# Patient Record
Sex: Female | Born: 1950 | Race: White | Hispanic: No | Marital: Married
Health system: Southern US, Community
[De-identification: ages and names within clinical notes are randomized; demographics above are authoritative.]

---

## 2002-07-13 ENCOUNTER — Ambulatory Visit (HOSPITAL_COMMUNITY): Admission: RE | Admit: 2002-07-13 | Discharge: 2002-07-14 | Payer: Self-pay | Admitting: Neurosurgery

## 2002-07-13 ENCOUNTER — Encounter: Payer: Self-pay | Admitting: Neurosurgery

## 2002-08-23 ENCOUNTER — Encounter: Admission: RE | Admit: 2002-08-23 | Discharge: 2002-08-23 | Payer: Self-pay | Admitting: Neurosurgery

## 2002-08-23 ENCOUNTER — Encounter: Payer: Self-pay | Admitting: Neurosurgery

## 2002-09-13 ENCOUNTER — Encounter: Admission: RE | Admit: 2002-09-13 | Discharge: 2002-09-13 | Payer: Self-pay | Admitting: Neurosurgery

## 2002-09-13 ENCOUNTER — Encounter: Payer: Self-pay | Admitting: Neurosurgery

## 2002-10-13 ENCOUNTER — Encounter: Payer: Self-pay | Admitting: Neurosurgery

## 2002-10-13 ENCOUNTER — Encounter: Admission: RE | Admit: 2002-10-13 | Discharge: 2002-10-13 | Payer: Self-pay | Admitting: Neurosurgery

## 2003-05-27 ENCOUNTER — Encounter: Payer: Self-pay | Admitting: Neurosurgery

## 2003-05-27 ENCOUNTER — Encounter: Admission: RE | Admit: 2003-05-27 | Discharge: 2003-05-27 | Payer: Self-pay | Admitting: Neurosurgery

## 2006-04-02 ENCOUNTER — Ambulatory Visit (HOSPITAL_COMMUNITY): Admission: RE | Admit: 2006-04-02 | Discharge: 2006-04-02 | Payer: Self-pay | Admitting: Surgery

## 2006-04-08 ENCOUNTER — Ambulatory Visit (HOSPITAL_COMMUNITY): Admission: RE | Admit: 2006-04-08 | Discharge: 2006-04-08 | Payer: Self-pay | Admitting: Surgery

## 2006-04-14 ENCOUNTER — Encounter: Admission: RE | Admit: 2006-04-14 | Discharge: 2006-07-13 | Payer: Self-pay | Admitting: Surgery

## 2006-07-08 ENCOUNTER — Ambulatory Visit (HOSPITAL_COMMUNITY): Admission: RE | Admit: 2006-07-08 | Discharge: 2006-07-08 | Payer: Self-pay | Admitting: Surgery

## 2007-01-20 ENCOUNTER — Ambulatory Visit: Payer: Self-pay | Admitting: Cardiology

## 2008-03-31 ENCOUNTER — Ambulatory Visit (HOSPITAL_COMMUNITY): Admission: RE | Admit: 2008-03-31 | Discharge: 2008-03-31 | Payer: Self-pay | Admitting: Surgery

## 2008-04-04 ENCOUNTER — Encounter: Admission: RE | Admit: 2008-04-04 | Discharge: 2008-05-09 | Payer: Self-pay | Admitting: Surgery

## 2008-08-16 ENCOUNTER — Encounter: Admission: RE | Admit: 2008-08-16 | Discharge: 2008-08-16 | Payer: Self-pay | Admitting: Neurosurgery

## 2008-08-25 ENCOUNTER — Encounter: Admission: RE | Admit: 2008-08-25 | Discharge: 2008-09-26 | Payer: Self-pay | Admitting: Surgery

## 2008-09-12 ENCOUNTER — Inpatient Hospital Stay (HOSPITAL_COMMUNITY): Admission: RE | Admit: 2008-09-12 | Discharge: 2008-09-14 | Payer: Self-pay | Admitting: Surgery

## 2008-09-13 ENCOUNTER — Ambulatory Visit: Payer: Self-pay | Admitting: Vascular Surgery

## 2008-09-13 ENCOUNTER — Encounter (INDEPENDENT_AMBULATORY_CARE_PROVIDER_SITE_OTHER): Payer: Self-pay | Admitting: Surgery

## 2008-09-27 ENCOUNTER — Encounter: Admission: RE | Admit: 2008-09-27 | Discharge: 2008-11-08 | Payer: Self-pay | Admitting: Surgery

## 2009-01-16 ENCOUNTER — Inpatient Hospital Stay (HOSPITAL_COMMUNITY): Admission: RE | Admit: 2009-01-16 | Discharge: 2009-01-19 | Payer: Self-pay | Admitting: Neurosurgery

## 2009-03-02 ENCOUNTER — Encounter: Admission: RE | Admit: 2009-03-02 | Discharge: 2009-03-02 | Payer: Self-pay | Admitting: Neurosurgery

## 2009-03-30 ENCOUNTER — Encounter: Admission: RE | Admit: 2009-03-30 | Discharge: 2009-03-30 | Payer: Self-pay | Admitting: Neurosurgery

## 2009-06-20 ENCOUNTER — Encounter: Admission: RE | Admit: 2009-06-20 | Discharge: 2009-06-20 | Payer: Self-pay | Admitting: Neurosurgery

## 2009-07-11 ENCOUNTER — Encounter: Admission: RE | Admit: 2009-07-11 | Discharge: 2009-07-11 | Payer: Self-pay | Admitting: Neurosurgery

## 2009-09-07 ENCOUNTER — Encounter: Admission: RE | Admit: 2009-09-07 | Discharge: 2009-09-07 | Payer: Self-pay | Admitting: Neurosurgery

## 2009-10-11 ENCOUNTER — Inpatient Hospital Stay (HOSPITAL_COMMUNITY): Admission: RE | Admit: 2009-10-11 | Discharge: 2009-10-15 | Payer: Self-pay | Admitting: Neurosurgery

## 2009-12-12 ENCOUNTER — Encounter: Admission: RE | Admit: 2009-12-12 | Discharge: 2009-12-12 | Payer: Self-pay | Admitting: Neurosurgery

## 2010-01-23 ENCOUNTER — Encounter: Admission: RE | Admit: 2010-01-23 | Discharge: 2010-01-23 | Payer: Self-pay | Admitting: Neurosurgery

## 2010-03-06 ENCOUNTER — Encounter: Admission: RE | Admit: 2010-03-06 | Discharge: 2010-03-06 | Payer: Self-pay | Admitting: Neurosurgery

## 2010-03-23 IMAGING — CR DG LUMBAR SPINE 2-3V
3 series · 3 of 3 positions shown · non-contrast
Comparison: None

CLINICAL DATA: Low back pain.

LUMBAR SPINE - 2-3 VIEW

[w l-spine lat *]
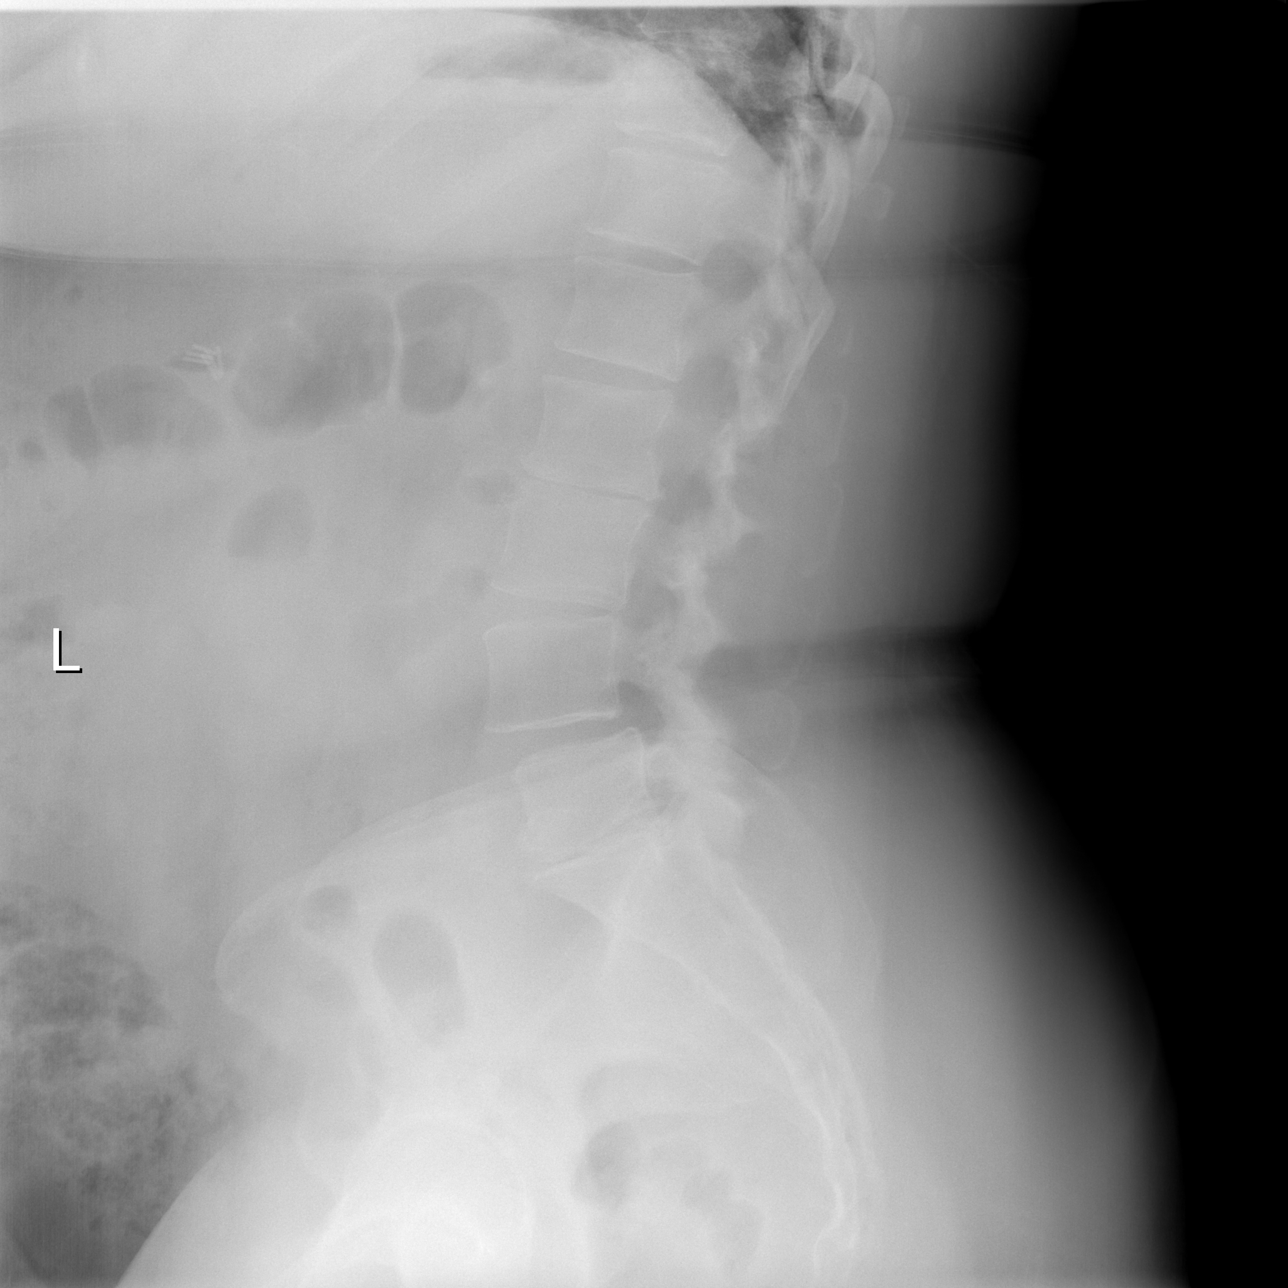

[w l-spine lat flexion *]
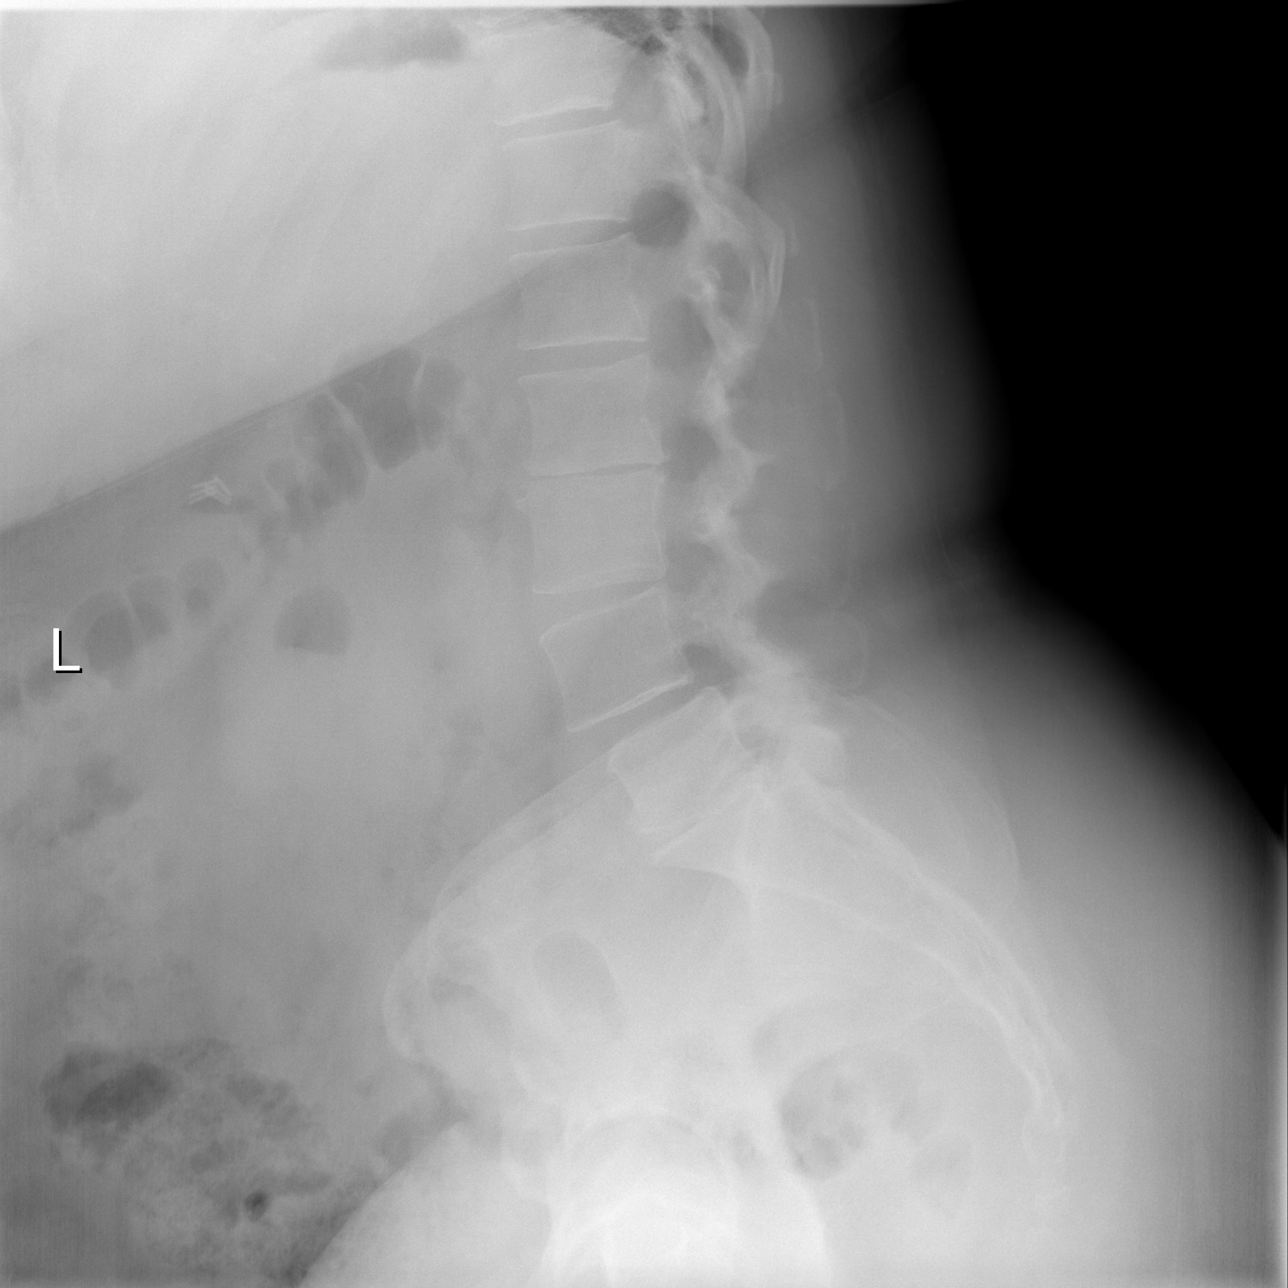

[w l-spine lat extension *]
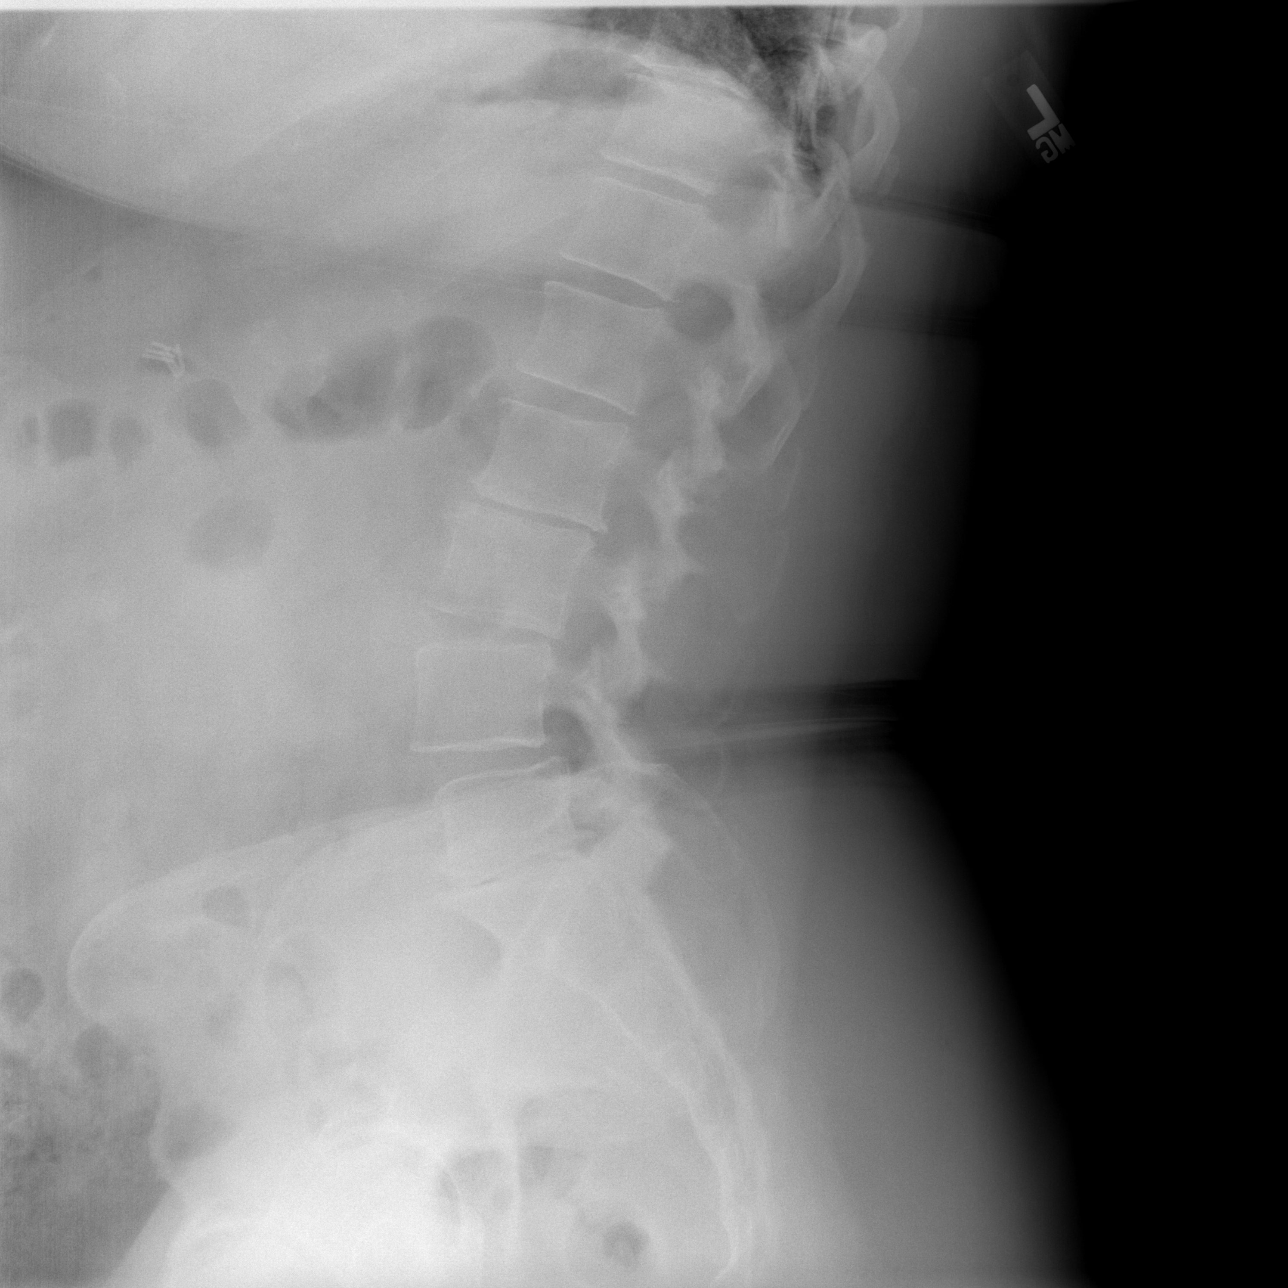

[3 of 3 positions shown; findings below may reference images not displayed]

FINDINGS: Assuming five non-rib bearing lumbar vertebrae advanced
degenerative disc disease is seen at L2-3 and L5-S1.  3 mm
anterolisthesis is seen with slight degenerative disc space
narrowing at L4-5 on neutral lateral upright view which measures 4
mm on flexion lateral view and 5 mm on extension view.  Slight
posterior spondylosis is seen at L2-3. Remaining vertebral
alignment is normally maintained.
IMPRESSION: 1.  Advanced degenerative disc disease L2-3 and L5-S1 with slight
posterior spondylosis L2-3.
2.  3-5 mm anterolisthesis L4-5 as described with otherwise normal
vertebral alignment.
3.  Slight degenerative disc space narrowing L4-5.
4.  No additional acute findings.

## 2010-06-21 ENCOUNTER — Encounter: Admission: RE | Admit: 2010-06-21 | Discharge: 2010-06-21 | Payer: Self-pay | Admitting: Neurosurgery

## 2010-09-04 ENCOUNTER — Encounter: Admission: RE | Admit: 2010-09-04 | Discharge: 2010-09-04 | Payer: Self-pay | Admitting: Neurosurgery

## 2010-10-18 ENCOUNTER — Encounter
Admission: RE | Admit: 2010-10-18 | Discharge: 2010-10-18 | Payer: Self-pay | Source: Home / Self Care | Attending: Neurosurgery | Admitting: Neurosurgery

## 2010-12-13 ENCOUNTER — Other Ambulatory Visit: Payer: Self-pay | Admitting: Neurosurgery

## 2010-12-13 DIAGNOSIS — M545 Low back pain: Secondary | ICD-10-CM

## 2010-12-18 ENCOUNTER — Ambulatory Visit
Admission: RE | Admit: 2010-12-18 | Discharge: 2010-12-18 | Disposition: A | Discharge status: Home / Self Care | Payer: BC Managed Care – PPO | Source: Ambulatory Visit | Attending: Neurosurgery | Admitting: Neurosurgery

## 2010-12-18 DIAGNOSIS — M545 Low back pain: Secondary | ICD-10-CM

## 2011-01-07 ENCOUNTER — Encounter (HOSPITAL_COMMUNITY)
Admission: RE | Admit: 2011-01-07 | Discharge: 2011-01-07 | Disposition: A | Discharge status: Home / Self Care | Payer: BC Managed Care – PPO | Source: Ambulatory Visit | Attending: Neurosurgery | Admitting: Neurosurgery

## 2011-01-07 LAB — CBC
HCT: 34.9 % — ABNORMAL LOW (ref 36.0–46.0)
Hemoglobin: 11.2 g/dL — ABNORMAL LOW (ref 12.0–15.0)
MCHC: 32.1 g/dL (ref 30.0–36.0)
MCV: 89 fL (ref 78.0–100.0)
Platelets: 337 10*3/uL (ref 150–400)

## 2011-01-07 LAB — BASIC METABOLIC PANEL
Calcium: 9.1 mg/dL (ref 8.4–10.5)
Chloride: 101 mEq/L (ref 96–112)
Creatinine, Ser: 0.58 mg/dL (ref 0.4–1.2)
Potassium: 4.1 mEq/L (ref 3.5–5.1)

## 2011-01-07 LAB — SURGICAL PCR SCREEN: MRSA, PCR: NEGATIVE

## 2011-01-10 ENCOUNTER — Inpatient Hospital Stay (HOSPITAL_COMMUNITY): Payer: BC Managed Care – PPO

## 2011-01-10 ENCOUNTER — Inpatient Hospital Stay (HOSPITAL_COMMUNITY)
Admission: RE | Admit: 2011-01-10 | Discharge: 2011-01-11 | DRG: 756 | Disposition: A | Discharge status: Home / Self Care | Payer: BC Managed Care – PPO | Source: Ambulatory Visit | Attending: Neurosurgery | Admitting: Neurosurgery

## 2011-01-10 DIAGNOSIS — K219 Gastro-esophageal reflux disease without esophagitis: Secondary | ICD-10-CM | POA: Diagnosis present

## 2011-01-10 DIAGNOSIS — Z9884 Bariatric surgery status: Secondary | ICD-10-CM

## 2011-01-10 DIAGNOSIS — Y831 Surgical operation with implant of artificial internal device as the cause of abnormal reaction of the patient, or of later complication, without mention of misadventure at the time of the procedure: Secondary | ICD-10-CM | POA: Diagnosis present

## 2011-01-10 DIAGNOSIS — Z981 Arthrodesis status: Secondary | ICD-10-CM

## 2011-01-10 DIAGNOSIS — Z87891 Personal history of nicotine dependence: Secondary | ICD-10-CM

## 2011-01-10 DIAGNOSIS — Y92009 Unspecified place in unspecified non-institutional (private) residence as the place of occurrence of the external cause: Secondary | ICD-10-CM

## 2011-01-10 DIAGNOSIS — T84498A Other mechanical complication of other internal orthopedic devices, implants and grafts, initial encounter: Principal | ICD-10-CM | POA: Diagnosis present

## 2011-01-13 NOTE — Op Note (Signed)
NAMEBRITTEN, Veronica Miller            ACCOUNT NO.:  [id number]  MEDICAL RECORD NO.:  [id number]           PATIENT TYPE:  I  LOCATION:  3524                         FACILITY:  [facility]  PHYSICIAN:  Donalee Citrin, M.D.        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  PROCEDURES:  Re-exploration of fusion, removal of hardware L2-L5, redo posterolateral fusion L2-3 with BMP and master graft and replacement of right L2 pedicle screw.  SURGEON:  Donalee Citrin, MD  ANESTHESIA.:  General endotracheal.  HISTORY OF PRESENT ILLNESS:  The patient is a very pleasant 59 year old female who has undergone previously L2-L5 fusion and healed the L3-L5 segment very well.  Based on CT scan, however, the patient looked like she developed pseudarthrosis at L2-3 with loosening of her L2 screws. There did appear to be some bone that formed in the interspace posteriorly and some posterolateral bone on the right.  However, there was significant healing around the L2 screws and the patient had persistent worsening back pain, so I recommended the patient re- exploration of fusion, removal of hardware, exploration of fusion at L2- 3 and possible redo fusion L2-3.  However, the patient had significant very small pedicles at L2 and the left-sided L2 screw.  There was no additional room for replacement of that screw, so I have told the patient that I would only be able to put hardware on the right should I have to replace that.  She understand the risks and agreed to proceed forward.  DESCRIPTION OF PROCEDURE:  The patient was brought to the OR and induced under anesthesia, positioned prone on Wilson frame.  Back was prepped and draped in the usual fashion.  Her old incision was opened up. Hardware was identified.  The cross-link was removed.  The nuts were removed.  The  screws at L2 were just freely floating.  These were subsequently removed.  All the screws on the left from L2-L5 were removed.  The L3 screw on the right was left behind and the L4 and L5 screws were removed.  The fusion construct was solid from L3 down to L5. The L2-3 area appeared to have small amount of movement on testing, so it was elected to redo this fusion, so I carried the dissection out exposing the T-piece at L2-3 bilaterally.  Then after aggressive decortication of the T-piece after copious irrigation creating a lot of bone dust and fragments, the BMP sponge small kit was opened up and overlaid posterolaterally on the left without master graft wedge over the top of this and then on the right.  In a similar fashion, aggressive decortication was carried out in T-piece.  BMP and master graft was overlaid at L2-3.  The pedicle on the right at L2 was probed and a 45- screw had been removed, so it was replaced with a 5550 and this screw did appear to have excellent purchase especially in the distal engagement.  Fluoroscopy was used to place that screw and confirm depth  and trajectory.  This was then locked and placed with top tightening nuts with a 40-mm rod.  Then, a large Hemovac drain was placed and the wound was closed in layers with interrupted Vicryl and the skin was closed with running 4-0 subcuticular.  Benzoin and Steri-Strips were applied.  The patient went to the recovery room in stable condition.  At the end of the case, sponge and instrument counts were correct.          ______________________________ Donalee Citrin, M.D.     GC/MEDQ  D:  01/10/2011  T:  01/11/2011  Job:  [id number]  Electronically Signed by Donalee Citrin M.D. on 01/13/2011 03:59:38 PM

## 2011-01-29 LAB — CBC
MCHC: 33.7 g/dL (ref 30.0–36.0)
Platelets: 340 10*3/uL (ref 150–400)
RBC: 3.8 MIL/uL — ABNORMAL LOW (ref 3.87–5.11)
WBC: 6.4 10*3/uL (ref 4.0–10.5)

## 2011-01-29 LAB — BASIC METABOLIC PANEL
BUN: 15 mg/dL (ref 6–23)
CO2: 30 mEq/L (ref 19–32)
Calcium: 9.4 mg/dL (ref 8.4–10.5)
Creatinine, Ser: 0.58 mg/dL (ref 0.4–1.2)
GFR calc Af Amer: >60 mL/min (ref >60)

## 2011-01-29 LAB — TYPE AND SCREEN
ABO/RH(D): A POS
Antibody Screen: NEGATIVE

## 2011-01-31 NOTE — Discharge Summary (Signed)
  NAMECERRA, EISENHOWER            ACCOUNT NO.:  [id number]  MEDICAL RECORD NO.:  [id number]           PATIENT TYPE:  I  LOCATION:  3524                         FACILITY:  [facility]  PHYSICIAN:  Donalee Citrin, M.D.        DATE OF BIRTH:  Mar 24, 1951  DATE OF ADMISSION:  01/10/2011 DATE OF DISCHARGE:  01/11/2011                              DISCHARGE SUMMARY   ADMITTING DIAGNOSIS:  Painful hardware.  DISCHARGE DIAGNOSES:  Painful hardware along with pseudoarthrosis at L2- 3.  HISTORY OF PRESENT ILLNESS:  The patient is very pleasant 60 year old female who underwent previous L2-S1 fusion presented with persistent back pain felt to be related to painful hardware with pseudarthrosis at L2-3.  The patient went to the operating room and diagnosed with pseudoarthrosis, underwent hardware removal and replacement one screw. Postoperatively, the patient did very well and recovering on the floor, was ambulating and voiding spontaneously on the floor by day #1.  Wound was clean and dry, and the patient was able be discharged home with scheduled followup in approximately 2 weeks on hospital day #1.          ______________________________ Donalee Citrin, M.D.     GC/MEDQ  D:  01/23/2011  T:  01/24/2011  Job:  [id number]  Electronically Signed by Donalee Citrin M.D. on 01/31/2011 08:36:56 AM

## 2011-02-07 LAB — CBC
HCT: 28.6 % — ABNORMAL LOW (ref 36.0–46.0)
HCT: 35.1 % — ABNORMAL LOW (ref 36.0–46.0)
Hemoglobin: 11.7 g/dL — ABNORMAL LOW (ref 12.0–15.0)
Hemoglobin: 9.5 g/dL — ABNORMAL LOW (ref 12.0–15.0)
MCHC: 33.3 g/dL (ref 30.0–36.0)
MCHC: 33.7 g/dL (ref 30.0–36.0)
MCV: 83.5 fL (ref 78.0–100.0)
MCV: 84 fL (ref 78.0–100.0)
MCV: 84.3 fL (ref 78.0–100.0)
MCV: 84.7 fL (ref 78.0–100.0)
Platelets: 197 10*3/uL (ref 150–400)
Platelets: 207 10*3/uL (ref 150–400)
Platelets: 251 10*3/uL (ref 150–400)
RBC: 3.09 MIL/uL — ABNORMAL LOW (ref 3.87–5.11)
RBC: 3.2 MIL/uL — ABNORMAL LOW (ref 3.87–5.11)
RBC: 3.37 MIL/uL — ABNORMAL LOW (ref 3.87–5.11)
RBC: 4.21 MIL/uL (ref 3.87–5.11)
RDW: 17.8 % — ABNORMAL HIGH (ref 11.5–15.5)
RDW: 18.3 % — ABNORMAL HIGH (ref 11.5–15.5)
WBC: 11.1 10*3/uL — ABNORMAL HIGH (ref 4.0–10.5)
WBC: 8 10*3/uL (ref 4.0–10.5)

## 2011-02-07 LAB — BASIC METABOLIC PANEL
CO2: 30 mEq/L (ref 19–32)
Calcium: 9.3 mg/dL (ref 8.4–10.5)
Chloride: 103 mEq/L (ref 96–112)
GFR calc Af Amer: >60 mL/min (ref >60)
Glucose, Bld: 94 mg/dL (ref 70–99)
Sodium: 141 mEq/L (ref 135–145)

## 2011-02-07 LAB — TYPE AND SCREEN: Antibody Screen: NEGATIVE

## 2011-02-12 ENCOUNTER — Ambulatory Visit
Admission: RE | Admit: 2011-02-12 | Discharge: 2011-02-12 | Disposition: A | Discharge status: Home / Self Care | Payer: BC Managed Care – PPO | Source: Ambulatory Visit | Attending: Neurosurgery | Admitting: Neurosurgery

## 2011-02-12 ENCOUNTER — Other Ambulatory Visit: Payer: Self-pay | Admitting: Neurosurgery

## 2011-02-12 DIAGNOSIS — M545 Low back pain: Secondary | ICD-10-CM

## 2011-02-12 DIAGNOSIS — M79609 Pain in unspecified limb: Secondary | ICD-10-CM

## 2011-03-12 NOTE — Op Note (Signed)
NAMERUSHIE, BRAZEL            ACCOUNT NO.:  [id number]   MEDICAL RECORD NO.:  [id number]          PATIENT TYPE:  INP   LOCATION:  1225                         FACILITY:  [facility]   PHYSICIAN:  Alfonse Ras, MD   DATE OF BIRTH:  [birth year]   DATE OF PROCEDURE:  DATE OF DISCHARGE:                               OPERATIVE REPORT   PREOPERATIVE DIAGNOSIS:  Status post laparoscopic Roux-en-Y gastric  bypass.   POSTOPERATIVE DIAGNOSIS:  Status post laparoscopic Roux-en-Y gastric  bypass, no evidence of leak at the anastomosis.   PROCEDURE:  Upper endoscopy.   DESCRIPTION:  With completion of laparoscopic Roux-en-Y gastric bypass  by Dr. Ezzard Standing, I introduced the Olympus endoscope in the oropharynx and  down the esophagus and D-line was identified.  At the 41 cm, the length  of the pouch was 5-cm at an patent anastomosis and no evidence of leak.  The remainder of the operative report will be dictated by Dr. Ezzard Standing.      Alfonse Ras, MD  Electronically Signed     KRE/MEDQ  D:  09/12/2008  T:  09/13/2008  Job:  [id number]

## 2011-03-12 NOTE — Op Note (Signed)
Veronica Miller, Veronica Miller NO.:  [id number]   MEDICAL RECORD NO.:  [id number]          PATIENT TYPE:  INP   LOCATION:  3002                         FACILITY:  [facility]   PHYSICIAN:  Donalee Citrin, M.D.        DATE OF BIRTH:  Sep 12, 1951   DATE OF PROCEDURE:  01/16/2009  DATE OF DISCHARGE:                               OPERATIVE REPORT   PREOPERATIVE DIAGNOSIS:  1. Degenerative disk disease, L4-L5 and L5-S1.  2. Severe lumbar spinal stenosis, L4-L5 and L5-S1.  3. 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   PROCEDURES:  Decompressive lumbar laminectomies, L4-L5 and L5-S1 and  posterior lumbar interbody fusion, L4-L5 and L5-S1 using a hybrid  Telamon PEEK cage packed with local autograft mixed with Actifuse and a  tangent allograft wedge at each level.  Pedicle screw fixation at L4-S1  using a 6.35 Legacy pedicle screw system.  Posterolateral arthrodesis L4-  S1 using local autograft packed with Actifuse bone substitute.   SURGEON:  Donalee Citrin, MD   ASSISTANT:  Kathaleen Maser. Pool, MD   ANESTHESIA:  General endotracheal.   HISTORY OF PRESENT ILLNESS:  The patient is a very pleasant 60 year old  female with progressive worsening back and leg pain going on now for  several months and years pain that was predominantly mechanical and  relatively worse with sitting to standing position and pain that would  run down her both legs, right leg greater down to the top of foot big  toe.  The patient had been refractory to all forms of conservative  treatment.  MRI scan showed severe degenerative disk disease with  diastasis of facet joints, synovial cyst on the left at L4-L5, severe  degenerative disease with motor changes at L5-S1, degenerative collapse  and biforaminal stenosis at the L5-S1 nerve roots.  Due to the patient's  failure to conservative treatment, clinical exam, and MRI findings, the  patient was recommended  decompression and stabilization procedure.  Risks and benefits of the operation were explained to the patient who  understood and agreed to proceed forward.   PROCEDURE IN DETAIL:  The patient was brought to the OR and induced  under general anesthesia, positioned prone on the Wilson frame.  Back  was prepped and draped in sterile fashion.  Preoperative x-ray localized  the appropriate level.  After infiltration of 10 mL of lidocaine with  epinephrine, a midline incision was made.  Bovie electrocautery was used  to take down the subperiosteum and dissection was then carried out  exposing the T-piece at L4, L5 and S1 bilaterally.  Intraoperative x-ray  confirmed localization at the appropriate level.  The self-retaining  retractors were then placed and the spinous processes at L4-L5 were  removed.  Complete medial facetectomies were drilled down and performed  after central decompression.  Decompressing the central canal, there was  noted to be marked stenosis in an hourglass fashion, predominantly at L5-  S1 but also at L4-L5 and marked diastasis of L4-L5 facet complexes.  The  synovial cyst on the  left was immediately identified.  This was removed  in piecemeal fashion.  Radical foraminotomies were carried out in the  L4, L5, and S1 nerve roots bilaterally and aggressive underbiting of the  superior facet process at each level was carried out to gain lateral  access to the disk space.  Then attention was first taken to pedicle  screw placement.  First working on the right L4 and the right was  drilled and a cannula with the awl probed.  A 5.5 tap initially was  placed and a 6.5 x 45 screw was inserted on the right.  Fluoroscopy used  to easily step along the way and the probe was tapped to easily step  along the way, however, during placement of the 6.5 x 45 screw, the  screw was noted to be deviating laterally.  AP fluoroscopy confirmed  these were to be laterally placed screws.  The  screw was removed.  A new  hole was selected and redirected more medially.  Capture of the vertebra  was then achieved and tapped with a 4.5 tap at this time and a 5.5 x 45  screw inserted with excellent purchase.  In a similar fashion at L5-S1,  6.5 x 45 screws were inserted at L5 and a 6.5 x 35 at S1.  All these  pedicle screws had excellent purchase and then on the left side, a 5.5 x  45 screw left at L4, 6.5 x 45 at L5, and 6.5 x 35 at S1.  After all 6  screws were placed, attention was taken to interbody work first working  at L4-L5.  This was noted to be markedly degenerated and this was  cleaned out and a size 10 distractor was inserted.  This had good  apposition and the endplates were felt to be appropriate size into the  graft material.  Then using a D'errico reflecting the left L5 nerve root  medially, this was then cleaned out with a size 10 cutter and chisel  endplates were prepped.  Central disk was then removed and scraped and  then a Telamon PEEK cage packed with local autograft mixed with Actifuse  was inserted on the patient's left side.  The right side distractor was  inserted in similar fashion.  Endplates were scraped.  Disk spaces were  adequately cleaned out, both centrally and laterally.  Local autograft  was packed centrally and a right-sided tangent was inserted.  At L5-S1,  the incision was noted to be markedly collapsed and had to be  sequentially distracted up from a 7 distractor to an 8 distractor and  cleaned out with a size 8 cutter and chisel.  Using Telamon on the right  and tangent on the left local autograft centrally, this disk space was  opened up, cleaned out, and noted to be markedly degenerated large  posterior osteophytes.  After all had been scraped away and removed, the  thecal sac was decompressed.  The wound was then copiously irrigated.  Meticulous hemostasis was maintained.  Gelfoam was laid on top of the  dura and then attention taken to  postop fluoroscopy, both AP and lateral  showed good position of screws and bone grafts.  Then aggressive  decortication of T-piece and the lateral gutters remainder of the local  autograft was packed posterolaterally.  The 7-mm rods were inserted and  tightened with S1-L5 screws.  Compression against S1 and the L4  compressed against the L5 taking care not to compress the right-sided L4  screw very much secondary to the malplacement of the venous or screw.  A  cross-link was inserted between the L5-S1 screws.  After all neural  foramina, I reinspected and noted be widely patent.  Gelfoam was then  overlaid on top of the dura.  A large Hemovac drain was placed.  Muscle  and fascia were reapproximated with interrupted Vicryls and skin was  closed with running 4-0 subcuticular.  Benzoin and Steri-Strips were  applied.  The patient went to the recovery room in stable condition.           ______________________________  Donalee Citrin, M.D.     GC/MEDQ  D:  01/16/2009  T:  01/17/2009  Job:  [id number]

## 2011-03-12 NOTE — Discharge Summary (Signed)
NAMETERRI, RORRER NO.:  [id number]   MEDICAL RECORD NO.:  [id number]          PATIENT TYPE:  INP   LOCATION:  1528                         FACILITY:  [facility]   PHYSICIAN:  Sandria Bales. Ezzard Standing, M.D.  DATE OF BIRTH:  1951-04-09   DATE OF ADMISSION:  09/12/2008  DATE OF DISCHARGE:  09/14/2008                               DISCHARGE SUMMARY   DISCHARGE DIAGNOSES:  1. Morbid obesity (weight of 255, body mass index of 40).  2. Sleep apnea on bilevel positive airway pressure.  3. History of hypertension.  4. Gastroesophageal reflux disease.  5. Allergies and bronchitis year round.  6. Osteoarthritis with neck and knee pain.   OPERATIONS PERFORMED:  The patient underwent a laparoscopic Roux-en-Y  gastric bypass on September 12, 2008.   HISTORY OF ILLNESS:  Ms. Amberg is a 60 year old white female, a patient  Dr. Fara Chute, who has been morbidly obese much of her adult life.  We  saw her for consideration for bariatric surgery and she went through our  bariatric preop program for evaluation for bariatric surgery.   COMORBID CONDITIONS:  1. Hypertension.  2. Sleep apnea on BiPAP.  3. Allergy and bronchitis which she has year round.  4. Gastroesophageal reflux disease.  5. Osteoarthritis involving her knees and neck which cause discomfort      and pain.   Discussion carried out with her about the options for surgical treatment  for obesity and she is interested in a laparoscopic Roux-en-Y gastric  bypass.   HOSPITAL COURSE:  On the day of admission, she was admitted to [facility] and underwent a laparoscopic Roux-en-Y gastric bypass  (antecolic and antegastric).   Postoperatively, she did well.  On her first postoperative day, her  hemoglobin was 11, hematocrit 34, white blood count 11,700.  She  underwent a gastrograffin swallow which showed no evidence of leak or  obstruction.  She underwent lower extremity Dopplers which showed no  evidence of DVT.   She was started on water.   By the second postoperative day, her hemoglobin is 11, hematocrit 33,  white blood count of 10,700.  She is afebrile and she will be advanced  to a protein drink to postop day 2 diet.  The plan is to let her go home  today.   DISCHARGE INSTRUCTIONS:  1. She should stay on her gastric bypass and diet until advanced by      the nutritionist.  2. She can shower.  3. She should not drive for 3-4 days.  4. No heavy lifting for 2 weeks.   DISCHARGE MEDICATIONS:  1. She is given Roxicet elixir for discharge as a pain medicine.  2. She can resume her home medications including Protonix 40 mg daily.  3. Hydrochlorothiazide 25 mg daily.  4. Allopurinol 300 mg daily.  5. Fexofenadine or Allegra 180 mg at bedtime.  6. Tiazac ER 120 mg nightly.  7. Osteo-Biflex.  8. Vitamin B complex.  9. Biotin.  10.Hydrocodone p.r.n.   CONDITION ON DISCHARGE:  Good.   FOLLOW UP:  Her return appointment to me again to see  me in about 2-3  weeks.      Sandria Bales. Ezzard Standing, M.D.  Electronically Signed     DHN/MEDQ  D:  09/14/2008  T:  09/14/2008  Job:  [id number]   cc:   Fara Chute  Fax: [phone]

## 2011-03-12 NOTE — Op Note (Signed)
NAMECARLO, LORSON            ACCOUNT NO.:  [id number]   MEDICAL RECORD NO.:  [id number]          PATIENT TYPE:  INP   LOCATION:  1225                         FACILITY:  [facility]   PHYSICIAN:  Sandria Bales. Ezzard Standing, M.D.  DATE OF BIRTH:  08/15/1951   DATE OF PROCEDURE:  09/12/2008  DATE OF DISCHARGE:                               OPERATIVE REPORT   Date of surgery ??   PREOPERATIVE DIAGNOSIS:  Morbid obesity, weight of 255, BMI of 40.   POSTOPERATIVE DIAGNOSIS:  Morbid obesity, weight of 255, BMI of 40.   PROCEDURE:  Laparoscopic Roux-en-Y gastric bypass  (anticolic,  antegastric).  Enterolysis of adhesions to anterior abdominal wall (15 minutes).   SURGEON:  Dr. Ovidio Kin.   FIRST ASSISTANT:  Dr. Baruch Merl.   ANESTHESIA:  General endotracheal.   ESTIMATED BLOOD LOSS:  Minimal.   INDICATIONS FOR PROCEDURE:  Ms. Stepanian is a 60 year old white female who  is a patient of Dr. Fara Chute, who has been through our preop  bariatric program and now comes for attempted laparoscopic Roux-en-Y  gastric bypass.   The indications and potential complications surgery were explained to  the patient.  Potential complications include, but not limited to,  bleeding, infection, bowel leak, deep venous thrombosis/pulmonary  embolism and long-term nutritional consequences.   OPERATIVE NOTE:  The patient was placed in the supine position.  A time-  out was held identifying the patient and the procedure.  Her abdomen was  prepped with Techni-Care and sterilely draped.  A Foley was placed.  She  was given antibiotics and had PAS stockings in place.   The abdomen was accessed through the left upper quadrant with 12-mm  Ethicon trocar.  I then placed six additional trocars were total of  seven.  There was a 5-mm subxiphoid trocar, a 12-mm right subcostal  trocar and 12 mm right paramedian trocar, an 11 mm infraumbilical  trocar, a 12 mm trocar to the left of midline and then a 5 mm left  lateral abdominal trocar.   I spent about the first 15 to 30 minutes taking down adhesions.  She had  some omental adhesions to her anterior abdominal wall.  These were less  than I expected because I did her gallbladder a couple of years ago and  most of the adhesions I had taken down at that time remained down.  She  also had adhesions of omentum stuck down to the pelvis.  I divided  those.  This took about 15 to 30 minutes of time.  I then was able to  elevate the colon.  I found the ligament of Treitz.  I counted 40 cm and  divided the small bowel with a white load of the Endo GIA 45-mm stapler.  I placed a Penrose drain on the future gastric limb and divided the  mesentery down about 5 cm.   I then counted 100 cm for the future gastric limb and carried out a side-  to-side jejunojejunal anastomosis with a 45-mm load of the Ethicon white  staples.  I closed the enterotomy with two running 2-0 Vicryl sutures  and closed the mesentery with a running 2-0 silk suture with Laparo-Ties  on both ends of this suture.  The diastasis looked good and I put  Tisseel over the anastomosis.  I then divided the omentum down to the  transverse colon.  This was particularly since some of the right side of  omentum was still stuck to the right lower quadrant where prior  appendectomy was done but this seemed to give a good window to her  future stomach pouch.   I then placed the patient in reverse Trendelenburg, placed a Nathanson  retractor on the left lobe of her liver.  I identified the angle of His  which I opened.  I went along the lesser curvature approximately 5 cm,  got into the lesser sac and then divided the stomach with first a firing  of the blue 45 mm Ethicon stapler, then two firings of the 60 mm Echelon  stapler, then another firing of the 45.  This created a pouch  approximately 5 cm in length, 3 cm in width.  I oversewed the  defunctioned stomach pouch with a locking 2-0 Vicryl  suture.   She had a little bleeding I think from one short gastric.  I did place  two clips up right at the angle of His under the diaphragm and this  seemed to control that bleeding.  I also packed it with some Surgicel.  I then brought the small bowel anticolic-antegastric and sewed a  posterior running suture of 2-0 Vicryl suture.  I made an enterotomy  between the stomach and the small bowel and used a blue load of a 45  Ethicon stapler.  I tried to create an anastomosis about 2.5 to 3 cm.  I  then closed the enterotomy with two running 2-0 Vicryl sutures.  I  passed the Ewald tube through this anastomosis and then oversewed an  anterior running suture of 2-0 Vicryl suture.  There was a single  bleeder in the anterior gastric wall.  I did a figure-of-eight stitch  over this which seemed to control the bleeding.   I did try to close the Peterson's defect with a suture between the  mesentery of the small bowel and the transverse colon.  This was with 2-  0 silk figure-of-eight stitch.  At this point Dr. Colin Benton broke scrub.  She did upper endoscopy.  She identified the GE junction about 40 cm,  anastomosis about 45 cm.  There was no bleeding.  The  anastomosis was  patent and I submerged the anastomosis underwater.  There was no  bubbling.   I then aspirated the fluid out.  I placed Tisseel over the gastrojejunal  anastomosis.  I reinspected each anastomosis, saw no evidence of any  bleeding, no other injury to the bowel was noted.  I then removed each  trocar in turn along with the [facility] retractor.  I closed the skin  wounds with 5-0 Monocryl suture, painted each wound with tincture of  Benzoin and steri-stripped it.   The patient tolerated the procedure well and was transported to recovery  room in good condition.  Sponge and needle count were correct at the end  of the case.      Sandria Bales. Ezzard Standing, M.D.  Electronically Signed     DHN/MEDQ  D:  09/12/2008  T:  09/13/2008   Job:  [id number]   cc:   Fara Chute  Fax: [phone]

## 2011-03-15 NOTE — Discharge Summary (Signed)
NAMELATIMA, HAMZA NO.:  [id number]   MEDICAL RECORD NO.:  [id number]          PATIENT TYPE:  INP   LOCATION:  3002                         FACILITY:  [facility]   PHYSICIAN:  Donalee Citrin, M.D.        DATE OF BIRTH:  Mar 29, 1951   DATE OF ADMISSION:  01/16/2009  DATE OF DISCHARGE:  01/19/2009                               DISCHARGE SUMMARY   ADMITTING DIAGNOSIS:  Degenerative disk disease L4-5, L5-S1 with  posterior lumbar interbody fusion L4-5 and L5-S1.   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   PROCEDURE:  Posterior lumbar interbody fusion L4-5 and L5-S1.   HOSPITAL COURSE:  The patient was admitted and she went to the operating  room and underwent the above-mentioned procedure.  Postop, the patient  did very well, recovering on the floor.  On the floor, the patient was  doing very well on day 1 with low grade temperature of 100.8, ambulating  and voiding, with physical therapy on first day and the patient was  resting, mobilizing well over the next 24-48 hours.  The drain taken out  day #2, her Foley was taken out on day #2, and by day #3, the patient  was tolerating p.o. pain medication.  She was afebrile with T-max of 100  and the patient was discharged home with oral pain medication as well as  an oral antibiotics for 5 days prescription.  Follow up in 1 week.           ______________________________  Donalee Citrin, M.D.     GC/MEDQ  D:  02/08/2009  T:  02/09/2009  Job:  [id number]

## 2011-03-15 NOTE — Op Note (Signed)
NAMEANTONELLA, Veronica Miller                        ACCOUNT NO.:  [id number]   MEDICAL RECORD NO.:  [id number]                   PATIENT TYPE:  OIB   LOCATION:  3172                                 FACILITY:  [facility]   PHYSICIAN:  Veronica Miller, M.D.                  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   PROCEDURE:  Anterior cervical diskectomies and fusion at C4-5 and C5-6 using  6 mm patellar wedge at C4-5, a 7 mm patellar wedge at C5-6, and a 42.5 mm  Atlantis plate with six 13 mm variable-angled screws.   SURGEON:  Veronica Miller, M.D.   ASSISTANT:  Veronica Miller, M.D.   ANESTHESIA:  General endotracheal anesthesia.   CLINICAL NOTE:  The patient is a very pleasant 60 year old female who has  had longstanding neck and right shoulder and arm pain that has been going on  for several months and gotten progressively worse and were refractory to  conservative treatment with anti-inflammatories, physical therapy.  The  patient's preoperative imaging shows severe spondylosis, multilevel disk  degeneration, and foraminal stenosis.  Her symptomatology was predominantly  C5 and C6 on the right with weakness of her triceps and deltoids.  The  patient is recommended anterior cervical diskectomy and fusion.  I  extensively explained the risks and benefits of surgery with her, and she  understands and agrees to proceed forward.   DESCRIPTION OF PROCEDURE:  The patient was brought in the OR and was induced  under general anesthesia, was positioned supine with the neck in slight  extension and five pounds of Holter traction.  The right side of the neck  was prepped and draped in the usual sterile fashion.  A preoperative x-ray  localized the C4-5 disk space, a curvilinear incision  made just inferior to  this, and the superficial layer of the platysma was dissected out and  divided longitudinally.  The avascular plane between the sternocleidomastoid  and the strap muscles was developed down to prevertebral fascia.  The  prevertebral fascia was dissected away with Kitners.  Intraoperative x-ray  confirmed localization of the C4-5 disk space.  Annulotomy was made with a  15 blade scalpel, and pituitary rongeurs were used to remove the anterior  margin of the annulus.  The longus colli was then reflected laterally, a  self-retaining retractor was placed at both C4-5 and C5-6.  Then using a 15  blade scalpel, annulotomy was made at C5-6.  Then using 2 and 3 mm Kerrison  punch, the anterior osteophytes were bitten off at C4-5 and C5-6 disk spaces  and a high-speed drill was used to drill down to the posterior osteophyte  and posterior annulus.  Then using 1 and 2 mm Kerrison punch first at C4-5  and under microscopic illumination, the operating microscope was then draped  and brought into the field.  The osteophyte at C4-5 coming off the C4  vertebral body was removed, exposing the posterior longitudinal ligament,  and this was removed in a piecemeal fashion, exposing the thecal sac and the  left C5 nerve root.  This was decompressed out its foramen and then  attention taken to the right side.  The right C5 nerve root was radically  decompressed out its foramen and explored with an angled nerve hook, noting  no further stenosis.  There were several fragments of bone spur removed off  the uncinate process of C4 as well as C5, and this was all removed in  piecemeal fashion.  At the end of the C4-5 diskectomy, both C5 neural  foramina were widely decompressed, especially the right.  At C5-6 the  procedure was repeated.  There was a large osteophytic process coming off  the C6 vertebral body and the uncinate process, compressing the right C6  nerve root.  This was all removed  in piecemeal fashion and radically  decompressed about both C6 neural foramina, explored with an angled nerve  hook and noted to have no further stenosis.  Both interspaces were copiously  irrigated.  The end plates were prepared to receive the bone graft, and a 6  and 7 mm patellar wedge was inserted under compression.  Then a 42.5 mm  Atlantis plate was sized, selected, and inserted, drilled, tapped, and six  13 mm variable-angled screws were inserted.  Meticulous hemostasis was  maintained, and the wound was copiously irrigated.  The platysma was  reapproximated with 3-0 interrupted Vicryl and the skin was closed with  running 4-0 subcuticular.  Benzoin and Steri-Strips were applied.  The  patient went to the recovery room in stable condition.  At the end of the  case, all needle counts and sponge counts were correct.                                               Veronica Miller, M.D.    GPC/MEDQ  D:  07/13/2002  T:  07/13/2002  Job:  [id number]

## 2011-05-21 ENCOUNTER — Other Ambulatory Visit: Payer: Self-pay | Admitting: Neurosurgery

## 2011-05-21 ENCOUNTER — Ambulatory Visit
Admission: RE | Admit: 2011-05-21 | Discharge: 2011-05-21 | Disposition: A | Discharge status: Home / Self Care | Payer: BC Managed Care – PPO | Source: Ambulatory Visit | Attending: Neurosurgery | Admitting: Neurosurgery

## 2011-05-21 DIAGNOSIS — M79609 Pain in unspecified limb: Secondary | ICD-10-CM

## 2011-05-21 DIAGNOSIS — M545 Low back pain, unspecified: Secondary | ICD-10-CM

## 2011-07-30 LAB — CBC
HCT: 33.9 — ABNORMAL LOW
HCT: 37.1
Hemoglobin: 11.3 — ABNORMAL LOW
Hemoglobin: 12.4
MCHC: 33.3
Platelets: 317
Platelets: 347
RBC: 4.19
RBC: 4.63
RDW: 17.6 — ABNORMAL HIGH
RDW: 17.7 — ABNORMAL HIGH
WBC: 10.7 — ABNORMAL HIGH
WBC: 11.7 — ABNORMAL HIGH

## 2011-07-30 LAB — DIFFERENTIAL
Basophils Absolute: 0
Basophils Relative: 1
Eosinophils Absolute: 0.2
Eosinophils Relative: 1
Lymphocytes Relative: 14
Lymphocytes Relative: 22
Lymphs Abs: 2.3
Monocytes Absolute: 1
Monocytes Relative: 10
Neutro Abs: 7.1
Neutro Abs: 9.1 — ABNORMAL HIGH
Neutrophils Relative %: 73
Neutrophils Relative %: 78 — ABNORMAL HIGH

## 2011-07-30 LAB — COMPREHENSIVE METABOLIC PANEL
ALT: 38 — ABNORMAL HIGH
Alkaline Phosphatase: 92
BUN: 15
CO2: 30
Calcium: 9.7
GFR calc non Af Amer: >60
Glucose, Bld: 111 — ABNORMAL HIGH
Sodium: 141

## 2011-07-30 LAB — HEMOGLOBIN AND HEMATOCRIT, BLOOD
HCT: 34.3 — ABNORMAL LOW
Hemoglobin: 11.3 — ABNORMAL LOW

## 2012-03-03 ENCOUNTER — Other Ambulatory Visit: Payer: Self-pay | Admitting: Neurosurgery

## 2012-03-03 DIAGNOSIS — M545 Low back pain: Secondary | ICD-10-CM

## 2012-03-10 ENCOUNTER — Ambulatory Visit
Admission: RE | Admit: 2012-03-10 | Discharge: 2012-03-10 | Disposition: A | Discharge status: Home / Self Care | Payer: BC Managed Care – PPO | Source: Ambulatory Visit | Attending: Neurosurgery | Admitting: Neurosurgery

## 2012-03-10 DIAGNOSIS — M545 Low back pain: Secondary | ICD-10-CM

## 2012-03-24 ENCOUNTER — Telehealth (INDEPENDENT_AMBULATORY_CARE_PROVIDER_SITE_OTHER): Payer: Self-pay | Admitting: Surgery

## 2012-03-24 NOTE — Telephone Encounter (Signed)
03/24/12 mailed recall letter to patient for bariatric surgery follow-up. Advised the patient to call [phone] to schedule an appointment...cef 

## 2012-07-24 IMAGING — CT CT L SPINE W/O CM
4 of 5 series · 16 of 33 positions shown, 19 images · non-contrast
Comparison: CT 10/18/2010

CLINICAL DATA: Low back pain with bilateral hip pain and leg
weakness.

CT LUMBAR SPINE WITHOUT CONTRAST
TECHNIQUE: Multidetector CT imaging of the lumbar spine was
performed without intravenous contrast administration. Multiplanar
CT image reconstructions were also generated.

[Series 3: l spine bone · axial · 0.27mm/px · z∈[-233,-86]mm · 5 of 89 slices shown, 7 images]
[im 15/89  soft-tissue]
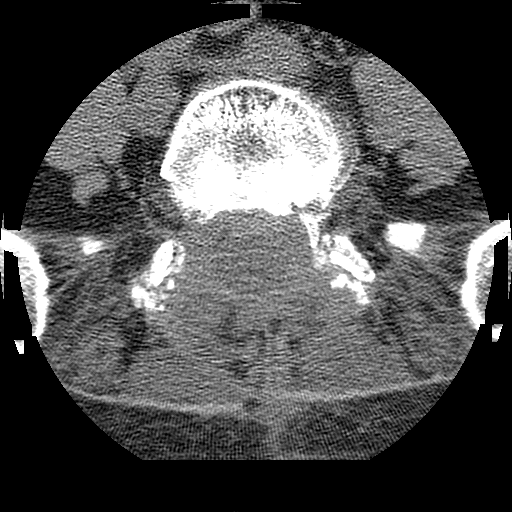
[im 15/89  bone]
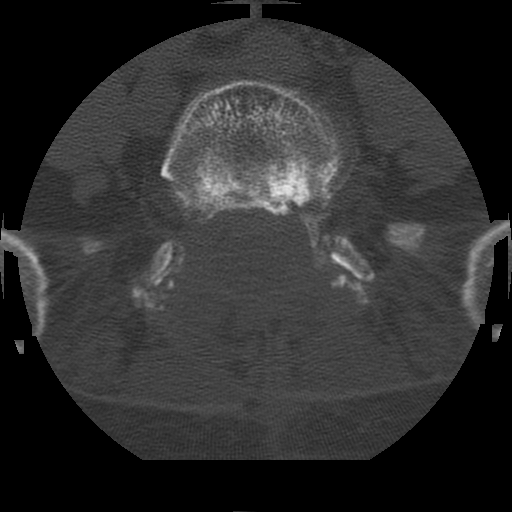
[im 30/89  bone]
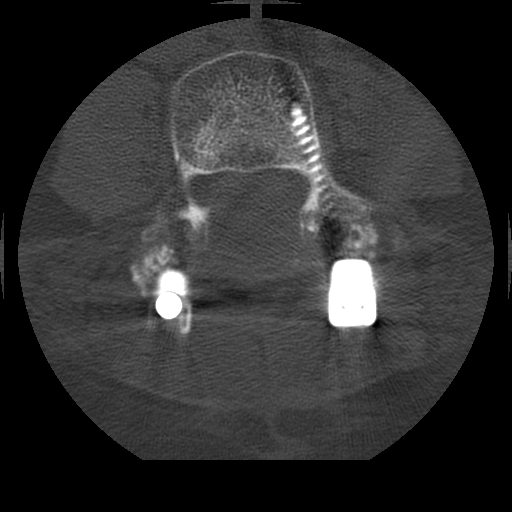
[im 45/89  bone]
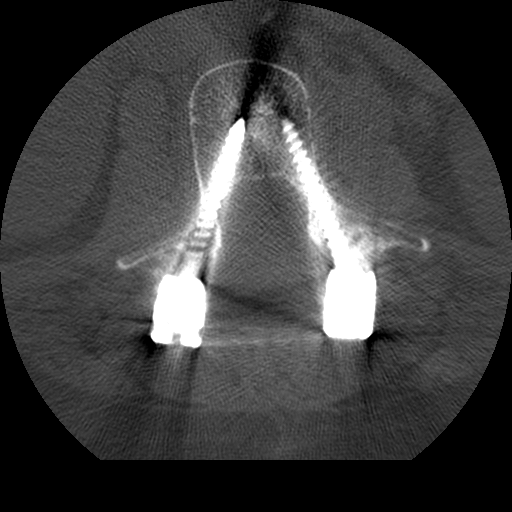
[im 59/89  bone]
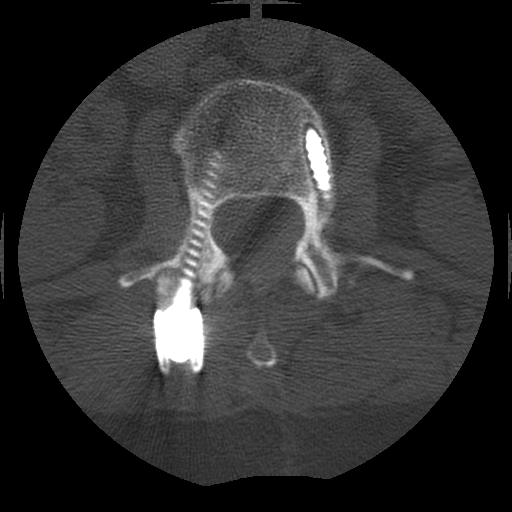
[im 74/89  soft-tissue]
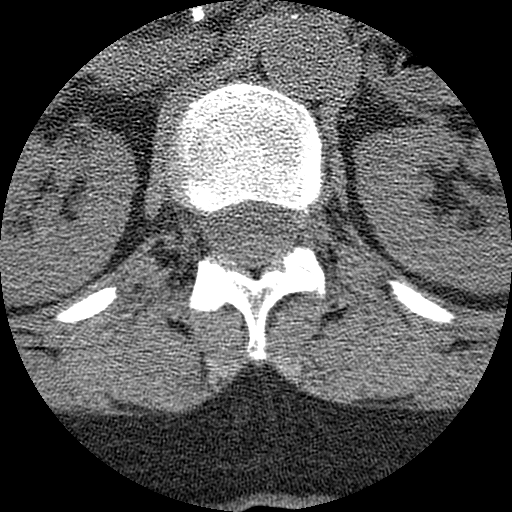
[im 74/89  bone]
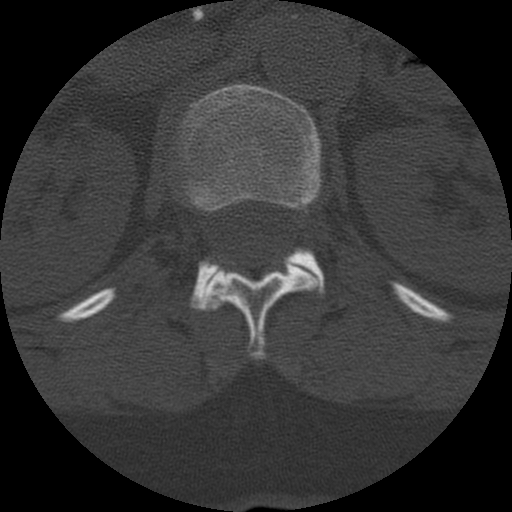

[Series 4: l spine soft · axial · 0.27mm/px · z∈[-233,-86]mm · 5 of 89 slices shown]
[im 15/89  soft-tissue]
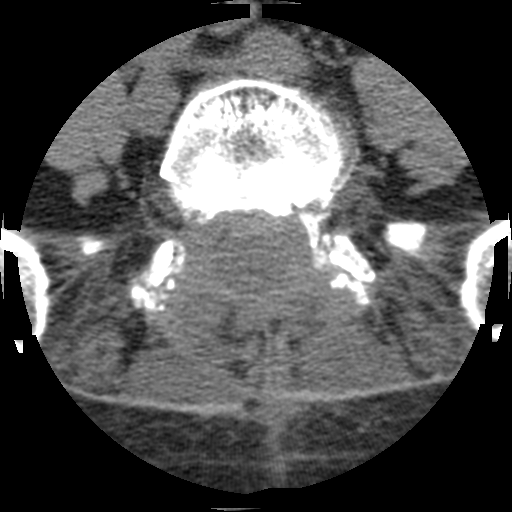
[im 30/89  soft-tissue]
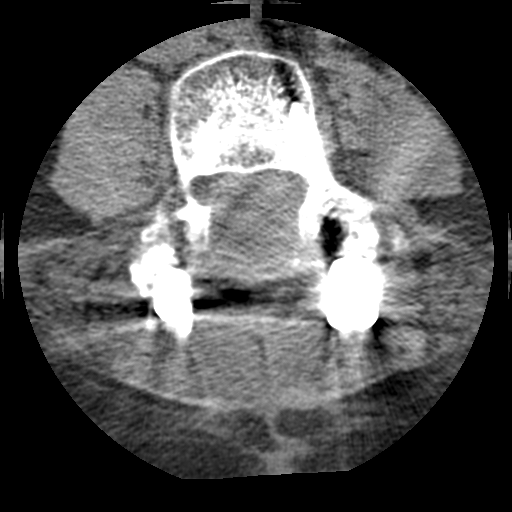
[im 45/89  soft-tissue]
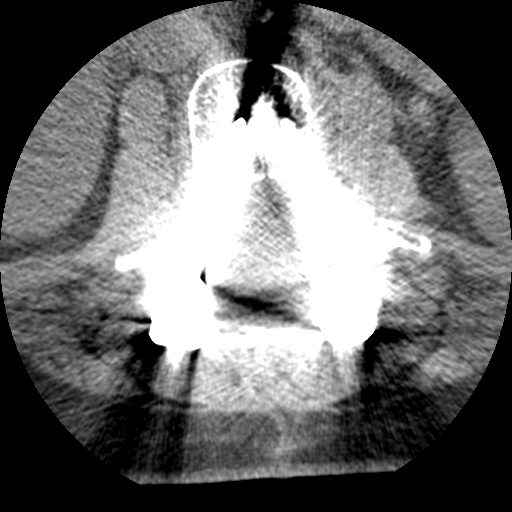
[im 59/89  soft-tissue]
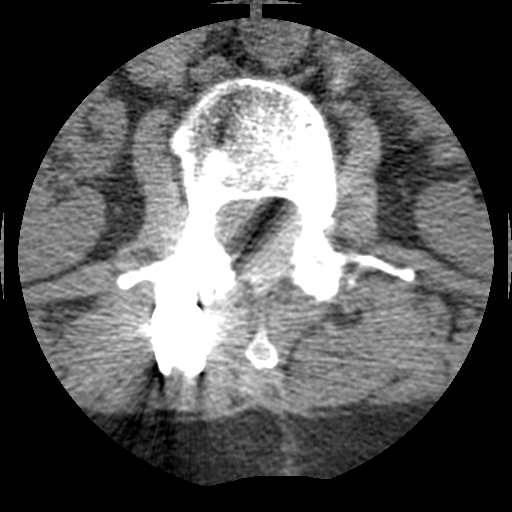
[im 74/89  soft-tissue]
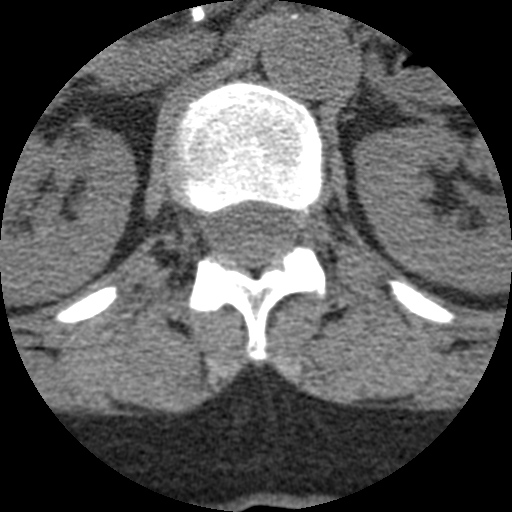

[Series 104: cor/lower · coronal · 0.35mm/px · 1 of 45 slices shown]
[im 23/45  bone]
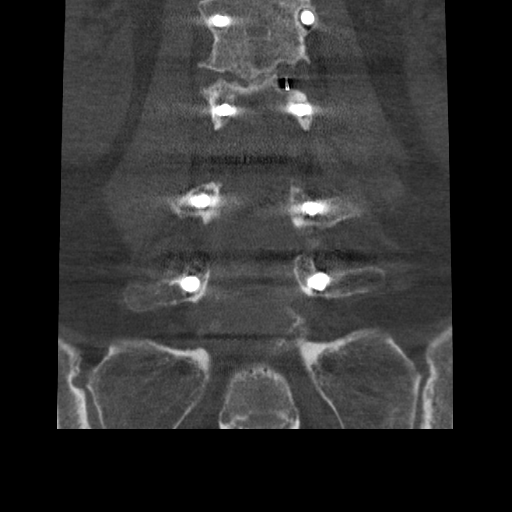

[Series 105: sag · sagittal · 0.44mm/px · 5 of 45 slices shown, 6 images]
[im 15/45  bone]
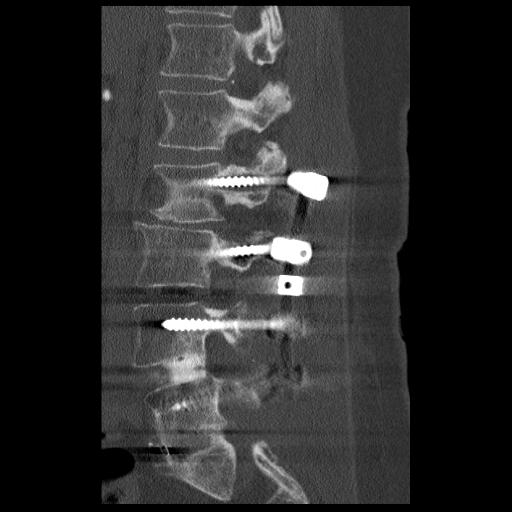
[im 19/45  bone]
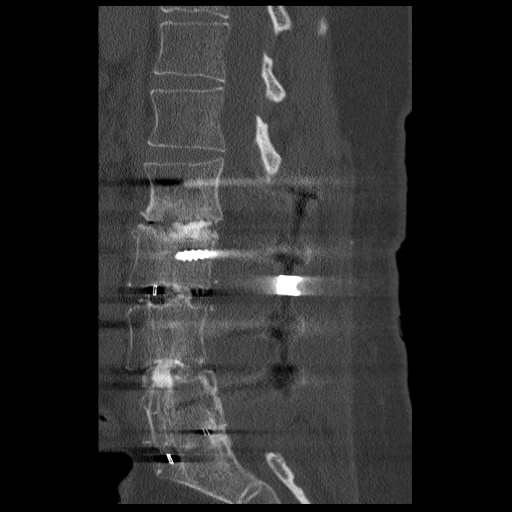
[im 23/45  soft-tissue]
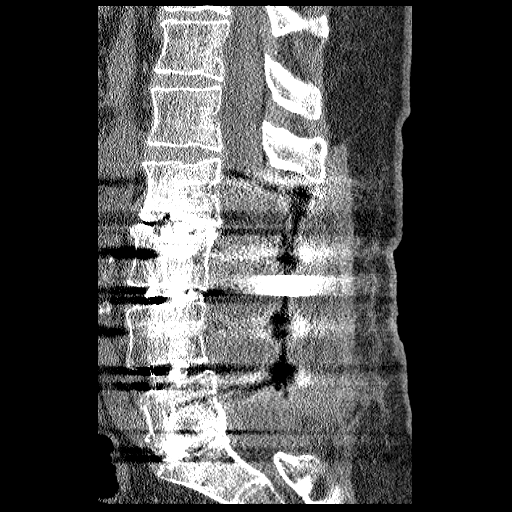
[im 23/45  bone]
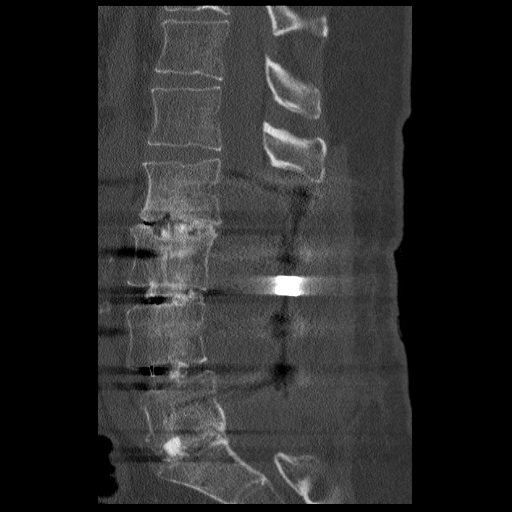
[im 26/45  bone]
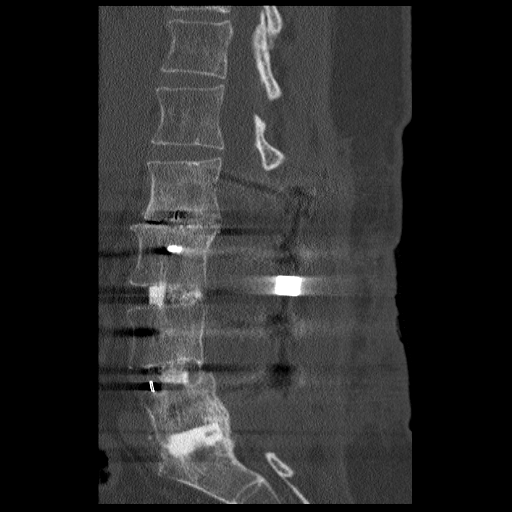
[im 30/45  bone]
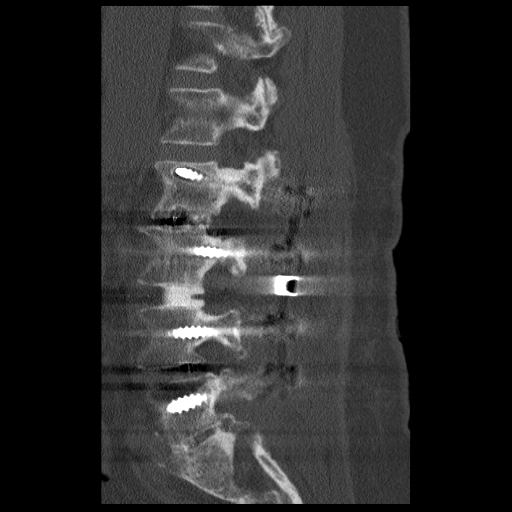

[16 of 33 positions shown; findings below may reference images not displayed]

FINDINGS: Negative for fracture.  Normal lumbar alignment.

T12-L1:  Mild disc degeneration.

L1-2:  Mild disc and facet degeneration without stenosis.

L2-3:  Again noted is significant lucency around the L2 pedicle
screws bilaterally compatible with loosening.  This is unchanged.
There is lack of bony union at this level with persistent lucency
across the disc space despite bone graft material.  This is
unchanged.  Adequate posterior decompression.

L3-4:  Satisfactory pedicle screw and interbody solid bony fusion
without stenosis.

L4-5:  Solid interbody bony fusion.  Pedicle screws are in good
position.

L5-S1:  Solid interbody fusion.  Pedicle screws have been removed
from S1 and  are in good position at L5.
IMPRESSION: No significant change from the prior CT.  There remains loosening
of the L2 screws bilaterally and pseudoarthrosis in the disc space.

## 2012-08-28 DEATH — deceased

## 2014-02-15 ENCOUNTER — Telehealth (HOSPITAL_COMMUNITY): Payer: Self-pay

## 2014-02-15 NOTE — Telephone Encounter (Signed)
This patient is overdue for recommended follow-up with a bariatric surgeon at [facility]. Call attempted today to reestablish post-op care with [facility], but unable to reach patient by phone.  A letter will be mailed to the patient today to the address on file from [facility] advising the patient on the benefits of follow-up care and directing them to call [facility] at [phone] to schedule an appointment at their earliest convenience.  ° °Amanda T. Fleming °Bariatric Office Coordinator °[phone] ° °

## 2014-03-11 ENCOUNTER — Telehealth (HOSPITAL_COMMUNITY): Payer: Self-pay

## 2014-03-11 NOTE — Telephone Encounter (Signed)
This patient is overdue for recommended follow-up with a [facility]ric surgeon at [facility]. Call attempted 4/21 to reestablish post-op care with [facility], but unable to reach patient by phone.  A letter was mailed to the patient same day to the address on file from [facility] advising the patient on the benefits of follow-up care and directing them to call [facility] at [phone] to schedule an appointment at their earliest convenience. However, letter was returned back to the Bariatric Dept this week advising that the patient expired in Oct 2013. Confirmed per obituary that patient passed 2011-11-07 at [place], [place].  Message has been routed to [facility] for updating her chart as I do not have security access for updating her file to deceased.  Jim LikeAmanda T. [facility] B[facility]ric Office Coordinator [phone]
